# Patient Record
Sex: Male | Born: 1995 | Race: Black or African American | Hispanic: No | Marital: Single | Smoking: Never smoker
Health system: Southern US, Community
[De-identification: ages and names within clinical notes are randomized; demographics above are authoritative.]

## PROBLEM LIST (undated history)

## (undated) DIAGNOSIS — J302 Other seasonal allergic rhinitis: Secondary | ICD-10-CM

## (undated) HISTORY — DX: Other seasonal allergic rhinitis: J30.2

---
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
## 2015-06-01 ENCOUNTER — Emergency Department
Admission: EM | Admit: 2015-06-01 | Discharge: 2015-06-01 | Disposition: A | Discharge status: Home / Self Care | Payer: Medicaid Other | Attending: Emergency Medicine | Admitting: Emergency Medicine

## 2015-06-01 ENCOUNTER — Emergency Department: Payer: Medicaid Other

## 2015-06-01 ENCOUNTER — Encounter: Payer: Self-pay | Admitting: Emergency Medicine

## 2015-06-01 DIAGNOSIS — K5900 Constipation, unspecified: Secondary | ICD-10-CM | POA: Insufficient documentation

## 2015-06-01 MED ORDER — DOCUSATE SODIUM 100 MG PO CAPS: 100.0000 mg | ORAL_CAPSULE | Freq: Two times a day (BID) | ORAL | Status: DC

## 2015-06-01 NOTE — ED Notes (Signed)
Reports difficulty having bm x 1 wk.

## 2015-06-01 NOTE — ED Provider Notes (Signed)
CSN: [id number]     Arrival date & time 06/01/15  1121 History   First MD Initiated Contact with Patient 06/01/15 1142     Chief Complaint  Patient presents with  . Constipation     HPI Comments: 19 year old male presents today with complaints of constipation x 3 days. Pt reports that he has had difficulty having a bowel movement for the past 3 days. He has not had a regular bowel movement since last weekend. He is having small, hard stools throughout the day. No abdominal pain, nausea or vomiting. No history of abdominal surgeries.   Patient is a 19 y.o. male presenting with constipation. The history is provided by the patient.  Constipation Severity:  Mild Time since last bowel movement:  3 days Timing:  Constant Progression:  Unchanged Chronicity:  New Context: not dehydration, not dietary changes, not medication and not narcotics   Stool description:  Hard, small and pellet like Relieved by:  None tried Ineffective treatments:  None tried Associated symptoms: no abdominal pain, no anorexia, no fever, no nausea and no vomiting   Risk factors: no hx of abdominal surgery     History reviewed. No pertinent past medical history. History reviewed. No pertinent past surgical history. History reviewed. No pertinent family history. History  Substance Use Topics  . Smoking status: Never Smoker   . Smokeless tobacco: Not on file  . Alcohol Use: No    Review of Systems  Constitutional: Negative for fever, chills and appetite change.  Gastrointestinal: Positive for constipation. Negative for nausea, vomiting, abdominal pain, rectal pain and anorexia.  All other systems reviewed and are negative.     Allergies  Review of patient's allergies indicates no known allergies.  Home Medications   Prior to Admission medications   Medication Sig Start Date End Date Taking? Authorizing Provider  docusate sodium (DULCOLAX) 100 MG capsule Take 1 capsule (100 mg total) by mouth 2 (two)  times daily. 06/01/15   Wilber Oliphant V, PA-C   BP 141/81 mmHg  Pulse 81  Temp(Src) 98.4 F (36.9 C) (Oral)  Resp 18  Ht 6' (1.829 m)  Wt 200 lb (90.719 kg)  BMI 27.12 kg/m2  SpO2 99% Physical Exam  Constitutional: He is oriented to person, place, and time. Vital signs are normal. He appears well-developed and well-nourished.  Non-toxic appearance. He does not have a sickly appearance. He does not appear ill.  HENT:  Head: Normocephalic and atraumatic.  Neck: Normal range of motion. Neck supple.  Cardiovascular: Normal rate, regular rhythm, normal heart sounds and intact distal pulses.   Pulmonary/Chest: Effort normal and breath sounds normal.  Abdominal: Soft. Bowel sounds are normal. He exhibits no distension. There is no tenderness. There is no rebound and no guarding.  Musculoskeletal: Normal range of motion.  Lymphadenopathy:    He has no cervical adenopathy.  Neurological: He is alert and oriented to person, place, and time.  Skin: Skin is warm and dry.  Psychiatric: He has a normal mood and affect. His behavior is normal. Judgment and thought content normal.  Nursing note and vitals reviewed.   ED Course  Procedures (including critical care time) Labs Review Labs Reviewed - No data to display  Imaging Review Dg Abd 1 View  06/01/2015   CLINICAL DATA:  Difficulty with bowel movement.  EXAM: ABDOMEN - 1 VIEW  COMPARISON:  None.  FINDINGS: Normal bowel gas pattern. No dilated bowel loops. Normal amount of stool in the rectosigmoid.  No  abnormal calcifications.  No focal bony lesion.  IMPRESSION: Negative.   Electronically Signed   By: Marlan Palau M.D.   On: 06/01/2015 12:54     EKG Interpretation None      I reviewed Xrays above. No significant constipation. No bowel obstruction   MDM  Discussed findings with patient. Wrote for dulcolax to take as needed. Also gave handout on ways to prevent constipation. Return for new or worsening symptoms.  Final diagnoses:   Constipation, unspecified constipation type        Luvenia Redden, PA-C 06/01/15 1935  Jene Every, MD 06/02/15 [phone]

## 2015-06-01 NOTE — Discharge Instructions (Signed)

## 2015-11-24 ENCOUNTER — Encounter: Payer: Self-pay | Admitting: Physician Assistant

## 2015-11-24 ENCOUNTER — Ambulatory Visit (INDEPENDENT_AMBULATORY_CARE_PROVIDER_SITE_OTHER): Payer: Medicaid Other | Admitting: Physician Assistant

## 2015-11-24 VITALS — BP 120/68 | HR 80 | Temp 98.2°F | Resp 16 | Ht 73.0 in | Wt 214.2 lb

## 2015-11-24 DIAGNOSIS — Z0000 Encounter for general adult medical examination without abnormal findings: Secondary | ICD-10-CM | POA: Diagnosis not present

## 2015-11-24 DIAGNOSIS — Z7189 Other specified counseling: Secondary | ICD-10-CM

## 2015-11-24 DIAGNOSIS — Z7689 Persons encountering health services in other specified circumstances: Secondary | ICD-10-CM

## 2015-11-24 NOTE — Patient Instructions (Signed)

## 2015-11-24 NOTE — Progress Notes (Signed)
Patient: Alexander Noble, Male    DOB: 1996/07/30, 19 y.o.   MRN: [id number] Visit Date: 11/24/2015  Today's Provider: Margaretann Loveless, PA-C   Chief Complaint  Patient presents with  . Establish Care   Subjective:    Annual physical exam Alexander Noble is a 19 y.o. male who presents today for health maintenance and complete physical and to Establish Care as a new patient. He feels well. He reports exercising not regurlarly. He reports he is sleeping well. He has never had labs checked.  He has no complaints today.  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 He is planning to transfer to [employer] when he has his credits into special education teaching.  Patient  Last PCP was [facility].  -----------------------------------------------------------------  Review of Systems  Constitutional: Negative.   HENT: Negative.   Eyes: Negative.   Respiratory: Negative.   Cardiovascular: Negative.   Gastrointestinal: Negative.   Endocrine: Negative.   Genitourinary: Negative.   Musculoskeletal: Negative.   Skin: Negative.   Allergic/Immunologic: Negative.   Neurological: Negative.   Hematological: Negative.   Psychiatric/Behavioral: Negative.     Social History      He  reports that he has never smoked. He does not have any smokeless tobacco history on file. He reports that he does not drink alcohol.       Social History   Social History  . Marital Status: Single    Spouse Name: N/A  . Number of Children: N/A  . Years of Education: N/A   Social History Main Topics  . Smoking status: Never Smoker   . Smokeless tobacco: None  . Alcohol Use: No  . Drug Use: None  . Sexual Activity: Not Asked   Other Topics Concern  . None   Social History Narrative    There are no active problems to display for this patient.   History reviewed. No pertinent past surgical history.  Family History    Family Status  Relation Status Death Age  . Mother Alive   . Father Alive   . Brother Alive   . Maternal Grandmother Deceased   . Maternal Grandfather Deceased   . Paternal Grandmother Alive   . Paternal Grandfather Deceased         His family history is not on file.    No Known Allergies  Previous Medications   CETIRIZINE (ZYRTEC) 10 MG TABLET    Take by mouth.    Patient Care Team: Margaretann Loveless, PA-C as PCP - General (Family Medicine)     Objective:   Vitals: BP 120/68 mmHg  Pulse 80  Temp(Src) 98.2 F (36.8 C) (Oral)  Resp 16  Ht  (1.854 m)  Wt 214 lb 3.2 oz (97.16 kg)  BMI 28.27 kg/m2   Physical Exam  Constitutional: He is oriented to person, place, and time. He appears well-developed and well-nourished.  HENT:  Head: Normocephalic and atraumatic.  Right Ear: Hearing, tympanic membrane, external ear and ear canal normal.  Left Ear: Hearing, tympanic membrane, external ear and ear canal normal.  Nose: Nose normal.  Mouth/Throat: Uvula is midline, oropharynx is clear and moist and mucous membranes are normal.  Eyes: Conjunctivae and EOM are normal. Pupils are equal, round, and reactive to light. Right eye exhibits no discharge.  Neck: Normal range of motion. Neck supple. No tracheal deviation present. No thyromegaly present.  Cardiovascular: Normal rate, regular rhythm, normal  heart sounds and intact distal pulses.  Exam reveals no gallop and no friction rub.   No murmur heard. Pulmonary/Chest: Effort normal and breath sounds normal. No respiratory distress. He has no wheezes. He has no rales. He exhibits no tenderness.  Abdominal: Soft. He exhibits no distension and no mass. There is no tenderness. There is no rebound and no guarding.  Musculoskeletal: Normal range of motion. He exhibits no edema or tenderness.  Lymphadenopathy:    He has no cervical adenopathy.  Neurological: He is alert and oriented to person, place, and time. He has normal  reflexes. No cranial nerve deficit. He exhibits normal muscle tone. Coordination normal.  Skin: Skin is warm and dry. No rash noted. No erythema.  Psychiatric: He has a normal mood and affect. His behavior is normal. Judgment and thought content normal.     Depression Screen No flowsheet data found.    Assessment & Plan:     Routine Health Maintenance and Physical Exam  1. Annual physical exam Exam was normal today and he has no complaints. I will check labs as below as he has never had any blood work done for baseline studies. I will follow-up with him pending his lab results. If labs are stable and within normal limits I will see him back in one year for his repeat annual physical exam. - CBC with Differential - Comprehensive Metabolic Panel (CMET) - TSH - Lipid panel  2. Establishing care with new doctor, encounter for Establishing care. [facility]. Was last seen for his well-child visit in 2015. He is healthy and has never had any issues. He is up-to-date on vaccinations.   Exercise Activities and Dietary recommendations Goals    None       There is no immunization history on file for this patient.  Health Maintenance  Topic Date Due  . HIV Screening  11/18/2009  . TETANUS/TDAP  11/18/2013  . INFLUENZA VACCINE  07/17/2015      Discussed health benefits of physical activity, and encouraged him to engage in regular exercise appropriate for his age and condition.    --------------------------------------------------------------------

## 2015-12-13 ENCOUNTER — Telehealth: Payer: Self-pay | Admitting: Physician Assistant

## 2015-12-13 NOTE — Telephone Encounter (Signed)
Pt states [place] will going [place] and is asking if his immunizations are up to date.  WU#[phone]/NFCB#[phone]/MW

## 2015-12-13 NOTE — Telephone Encounter (Signed)
Spoke with patient, [place] but patient does not know which ones he needs, advised patient to call health departments and ask due to we do not have that insurance. Pt understood and will let us know-aa

## 2015-12-25 ENCOUNTER — Ambulatory Visit: Payer: Self-pay | Admitting: Physician Assistant

## 2016-01-03 ENCOUNTER — Telehealth: Payer: Self-pay | Admitting: Physician Assistant

## 2016-01-03 NOTE — Telephone Encounter (Signed)
Do you have this records with you. Can you let me know what immunizations he needs.  Thanks,  -Rosaria Kubin

## 2016-01-03 NOTE — Telephone Encounter (Signed)
Pt states he is going to the [place] in 2 weeks and is asking if he will need any immunization/shots before he takes this trip.  Pt states his previous doctor has faxed these records today. Pt states he has rec'd a text from nurse Purnell Shoemaker stating these records were rec'd.  ZO#[phone]/WJ

## 2016-01-03 NOTE — Telephone Encounter (Signed)
Yes and he may require flu, MMR 2 and HPV (if wanted). 

## 2016-01-04 NOTE — Telephone Encounter (Signed)
Patient advised that we don't have Typhoid vaccine. Patient verbalized understanding.  Thanks,   -Marylyn Appenzeller

## 2016-01-04 NOTE — Telephone Encounter (Signed)
Advised patient as directed below. Per patient he is only required the Typhoid Vaccine and wanted to know how much it was here because he wants to compare prices. Patient is not insured  Thanks,  -Darrill Vreeland

## 2016-05-01 ENCOUNTER — Ambulatory Visit (INDEPENDENT_AMBULATORY_CARE_PROVIDER_SITE_OTHER): Payer: Self-pay | Admitting: Physician Assistant

## 2016-05-01 ENCOUNTER — Encounter: Payer: Self-pay | Admitting: Physician Assistant

## 2016-05-01 VITALS — BP 140/88 | HR 92 | Temp 98.5°F | Resp 16 | Wt 217.6 lb

## 2016-05-01 DIAGNOSIS — J029 Acute pharyngitis, unspecified: Secondary | ICD-10-CM

## 2016-05-01 NOTE — Patient Instructions (Signed)

## 2016-05-01 NOTE — Progress Notes (Signed)
Patient: Alexander MealingChristopher G Aure Male    DOB: 09/20/1996   20 y.o.   MRN: [id number] Visit Date: 05/01/2016  Today's Provider: Margaretann LovelessJennifer M Richmond Coldren, PA-C   Chief Complaint  Patient presents with  . Sore Throat   Subjective:    Sore Throat  This is a new problem. The current episode started in the past 7 days (Monday morning). The problem has been gradually improving. Neither side of throat is experiencing more pain than the other. There has been no fever. The patient is experiencing no pain (currently; was a 3-4/10 on Sunday night, Monday and Tuesday). Pertinent negatives include no abdominal pain, congestion, coughing, ear discharge, ear pain, headaches, hoarse voice, plugged ear sensation, neck pain, shortness of breath, swollen glands, trouble swallowing or vomiting. He has had no exposure to strep or mono. Treatments tried: Severe Flu/cold tea([employer] brand) The treatment provided mild relief.      No Known Allergies Previous Medications   CETIRIZINE (ZYRTEC) 10 MG TABLET    Take by mouth.    Review of Systems  Constitutional: Negative for fever, chills and fatigue.  HENT: Positive for sore throat. Negative for congestion, ear discharge, ear pain, hoarse voice, postnasal drip, rhinorrhea, sinus pressure, sneezing and trouble swallowing.   Respiratory: Negative for cough, chest tightness and shortness of breath.   Cardiovascular: Negative for chest pain.  Gastrointestinal: Negative for nausea, vomiting and abdominal pain.  Musculoskeletal: Negative for neck pain.  Neurological: Negative for headaches.    Social History  Substance Use Topics  . Smoking status: Never Smoker   . Smokeless tobacco: Not on file  . Alcohol Use: No   Objective:   BP 140/88 mmHg  Pulse 92  Temp(Src) 98.5 F (36.9 C) (Oral)  Resp 16  Wt 217 lb 9.6 oz (98.703 kg)  Physical Exam  Constitutional: He appears well-developed and well-nourished. No distress.  HENT:  Head: Normocephalic and  atraumatic.  Right Ear: Hearing, tympanic membrane, external ear and ear canal normal. Tympanic membrane is not erythematous and not bulging. No middle ear effusion.  Left Ear: Hearing, tympanic membrane, external ear and ear canal normal. Tympanic membrane is not erythematous and not bulging.  No middle ear effusion.  Nose: Mucosal edema present. No rhinorrhea. Right sinus exhibits no maxillary sinus tenderness and no frontal sinus tenderness. Left sinus exhibits no maxillary sinus tenderness and no frontal sinus tenderness.  Mouth/Throat: Uvula is midline and mucous membranes are normal. Posterior oropharyngeal erythema present. No oropharyngeal exudate or posterior oropharyngeal edema.  Eyes: Conjunctivae and EOM are normal. Pupils are equal, round, and reactive to light. Right eye exhibits no discharge. Left eye exhibits no discharge.  Neck: Normal range of motion. Neck supple. No tracheal deviation present. No Brudzinski's sign and no Kernig's sign noted. No thyromegaly present.  Cardiovascular: Normal rate, regular rhythm and normal heart sounds.  Exam reveals no gallop and no friction rub.   No murmur heard. Pulmonary/Chest: Effort normal and breath sounds normal. No stridor. No respiratory distress. He has no wheezes. He has no rales.  Lymphadenopathy:    He has no cervical adenopathy.  Skin: Skin is warm and dry. He is not diaphoretic.  Vitals reviewed.       Assessment & Plan:     1. Viral pharyngitis Symptoms improving. Continue OTC symptomatic treatment as needed. Salt water gargles. IBU for fever if needed. Call if symptoms worsen again.       Margaretann LovelessJennifer M Kaidyn Javid, PA-C  [place]  [facility]

## 2016-09-11 ENCOUNTER — Emergency Department
Admission: EM | Admit: 2016-09-11 | Discharge: 2016-09-11 | Disposition: A | Discharge status: Home / Self Care | Payer: Medicaid Other | Attending: Emergency Medicine | Admitting: Emergency Medicine

## 2016-09-11 ENCOUNTER — Encounter: Payer: Self-pay | Admitting: Emergency Medicine

## 2016-09-11 DIAGNOSIS — K047 Periapical abscess without sinus: Secondary | ICD-10-CM | POA: Insufficient documentation

## 2016-09-11 DIAGNOSIS — K0889 Other specified disorders of teeth and supporting structures: Secondary | ICD-10-CM

## 2016-09-11 MED ORDER — AMOXICILLIN 500 MG PO CAPS: 500.0000 mg | ORAL_CAPSULE | Freq: Three times a day (TID) | ORAL | 0 refills | Status: DC

## 2016-09-11 NOTE — ED Notes (Addendum)
Pt states L sided tooth pain since eating bojangles chicken 2 days ago. Thought bone was stuck in teeth or chipped tooth.

## 2016-09-11 NOTE — ED Triage Notes (Addendum)
C/o pain to left back teeth, not sure if top or bottom. Denies fevers or swelling. Reports had bone in tooth from eating bojangles and pain still there.

## 2016-09-11 NOTE — ED Triage Notes (Signed)
Patient to ER for c/o dental pain after biting into chicken bone.

## 2016-09-11 NOTE — Discharge Instructions (Signed)
Take the antibiotic as directed. Rinse with warm-salty water after every meal. Follow-up with one of the dental clinics listed for routine care.   Take your antibiotic 3-times a day at 8 am / 2 pm / 8 pm daily.

## 2016-09-12 NOTE — ED Provider Notes (Signed)
[facility] Emergency Department Provider Note ____________________________________________  Time seen: 1912  I have reviewed the triage vital signs and the nursing notes.  HISTORY  Chief Complaint  Dental Pain  HPI Alexander Noble is a 20 y.o. male visits to the ED for evaluation of dental pain to the left lower molar after allegedly accidentally biting a small piece of chicken bone. He describes pain after smokeless chicken bone was lodged either between his molars or in the gum next to his molar. He was able to remove the small sliver of bone manually. Since that time he's had some increased tenderness and pain with chewing and biting to the right lower jaw. He denies any fevers, chills, sweats. Also denies any local swelling, or purulent drainage.He denies any continued foreign body sensation or any difficulty swallowing.  History reviewed. No pertinent past medical history.  There are no active problems to display for this patient.  History reviewed. No pertinent surgical history.  Prior to Admission medications   Medication Sig Start Date End Date Taking? Authorizing Provider  amoxicillin (AMOXIL) 500 MG capsule Take 1 capsule (500 mg total) by mouth 3 (three) times daily. 09/11/16   Shatana Saxton V Bacon Lydia Meng, PA-C  cetirizine (ZYRTEC) 10 MG tablet Take by mouth.    Historical Provider, MD    Allergies Review of patient's allergies indicates no known allergies.  History reviewed. No pertinent family history.  Social History Social History  Substance Use Topics  . Smoking status: Never Smoker  . Smokeless tobacco: Not on file  . Alcohol use No    Review of Systems  Constitutional: Negative for fever. Eyes: Negative for visual changes. ENT: Negative for sore throat. Dental pain as above.  Gastrointestinal: Negative for abdominal pain, vomiting and diarrhea. ____________________________________________  PHYSICAL EXAM:  VITAL SIGNS: ED  Triage Vitals  Enc Vitals Group     BP 09/11/16 1857 (!) 151/98     Pulse Rate 09/11/16 1857 99     Resp 09/11/16 1857 16     Temp 09/11/16 1857 97.3 F (36.3 C)     Temp Source 09/11/16 1857 Oral     SpO2 09/11/16 1857 99 %     Weight 09/11/16 1855 150 lb (68 kg)     Height 09/11/16 1855 5\' 9"  (1.753 m)     Head Circumference --      Peak Flow --      Pain Score 09/11/16 1856 8     Pain Loc --      Pain Edu? --      Excl. in GC? --    Constitutional: Alert and oriented. Well appearing and in no distress. Head: Normocephalic and atraumatic. Eyes: Conjunctivae are normal. PERRL. Normal extraocular movements Mouth/Throat: Mucous membranes are moist. Patient with a partially erupted wisdom tooth and lower left jaw. There does appear to be some local gum erythema and mild edema. No fluctuance, pointing, spontaneous drainage is appreciated. Patient with generalized gingivitis on exam. Neck: Supple. No thyromegaly. Hematological/Lymphatic/Immunological: No cervical lymphadenopathy. Neurologic: Normal speech and language. No gross focal neurologic deficits are appreciated. Psychiatric: Mood and affect are normal. Patient exhibits appropriate insight and judgment. ____________________________________________  INITIAL IMPRESSION / ASSESSMENT AND PLAN / ED COURSE  Patient with acute dental pain and probable local gum infection following a foreign body to the gum. He is discharged with a prescription for moxifloxacin 2 doses directed. He is also advised to utilize warm salt water or Listerine for mild antiseptic. He will  follow with the local dental providers for ongoing symptom management.  Clinical Course   ____________________________________________  FINAL CLINICAL IMPRESSION(S) / ED DIAGNOSES  Final diagnoses:  Pain, dental  Dental infection      Lissa HoardJenise V Bacon Janeah Kovacich, PA-C 09/12/16 0114    Minna AntisKevin Paduchowski, MD 09/12/16 2300

## 2016-12-02 IMAGING — CR DG ABDOMEN 1V
1 series · 2 of 2 positions shown · non-contrast
Comparison: None.

CLINICAL DATA: Difficulty with bowel movement.

EXAM:
ABDOMEN - 1 VIEW

[Series 1: dg abd 1 view · 0.14mm/px · 2 of 2 slices shown]
[im 1/2]
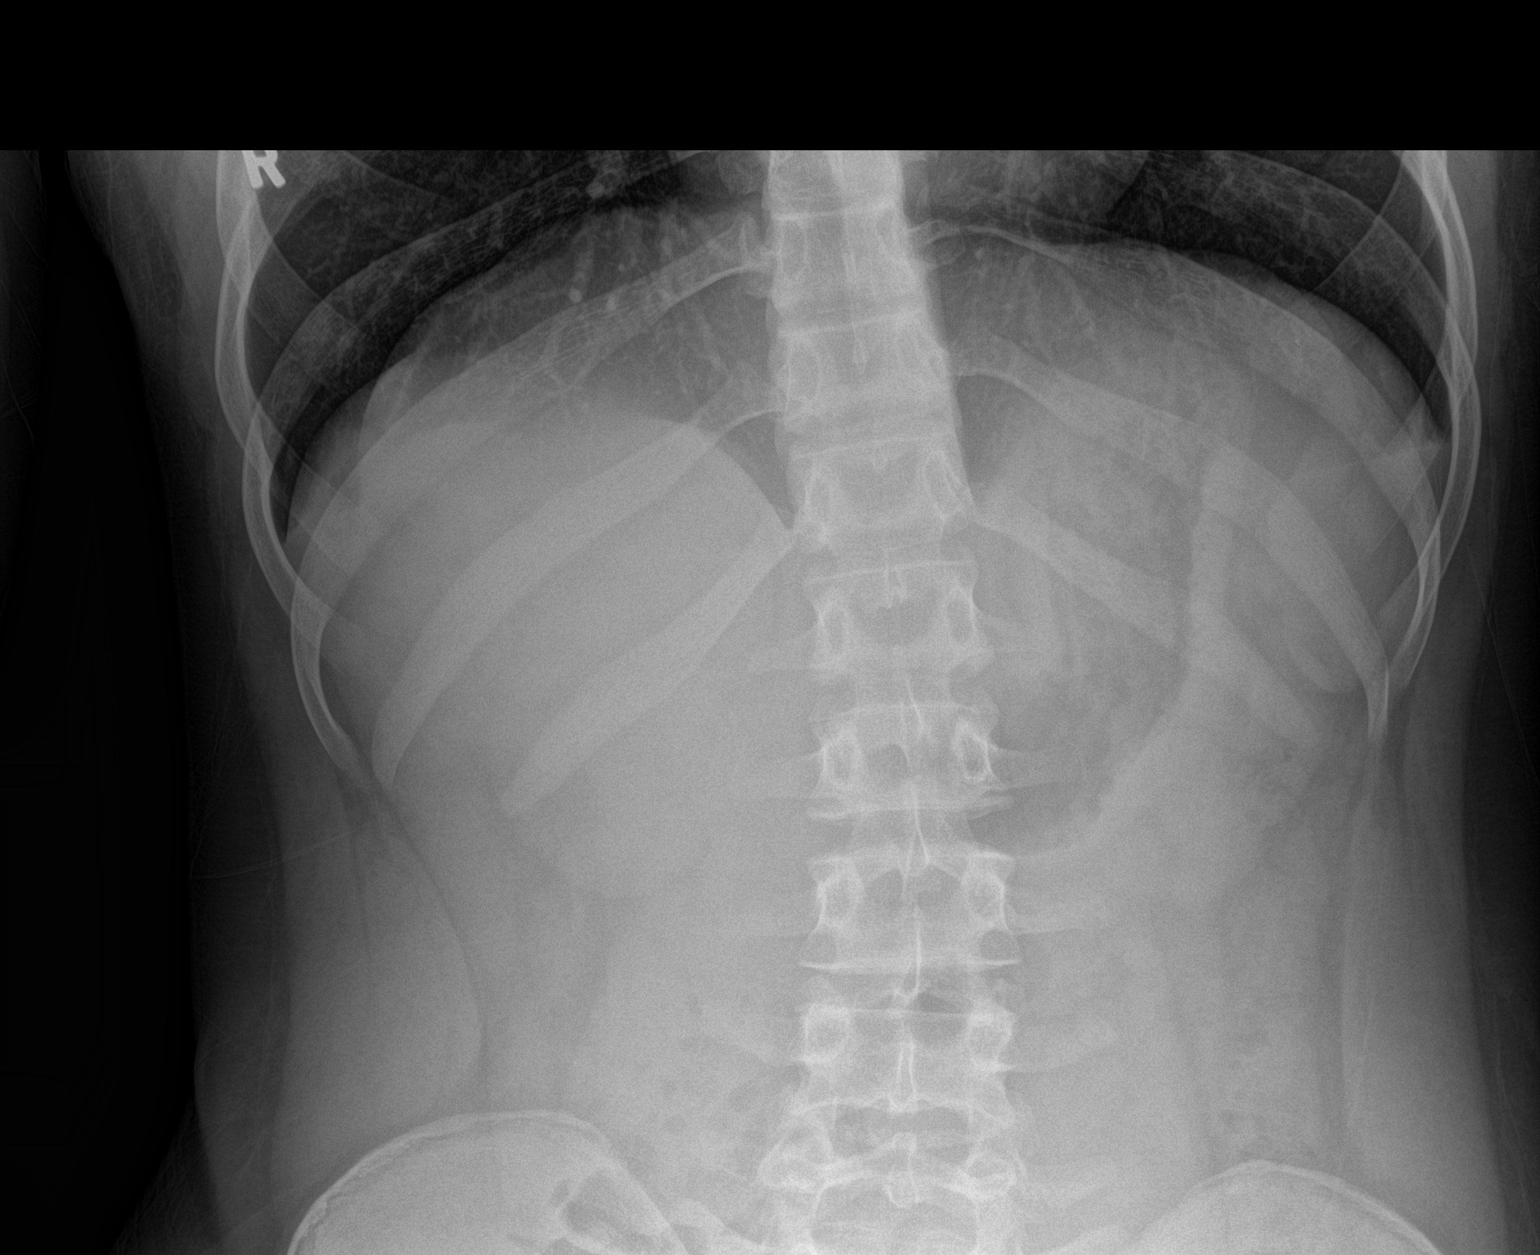
[im 2/2]
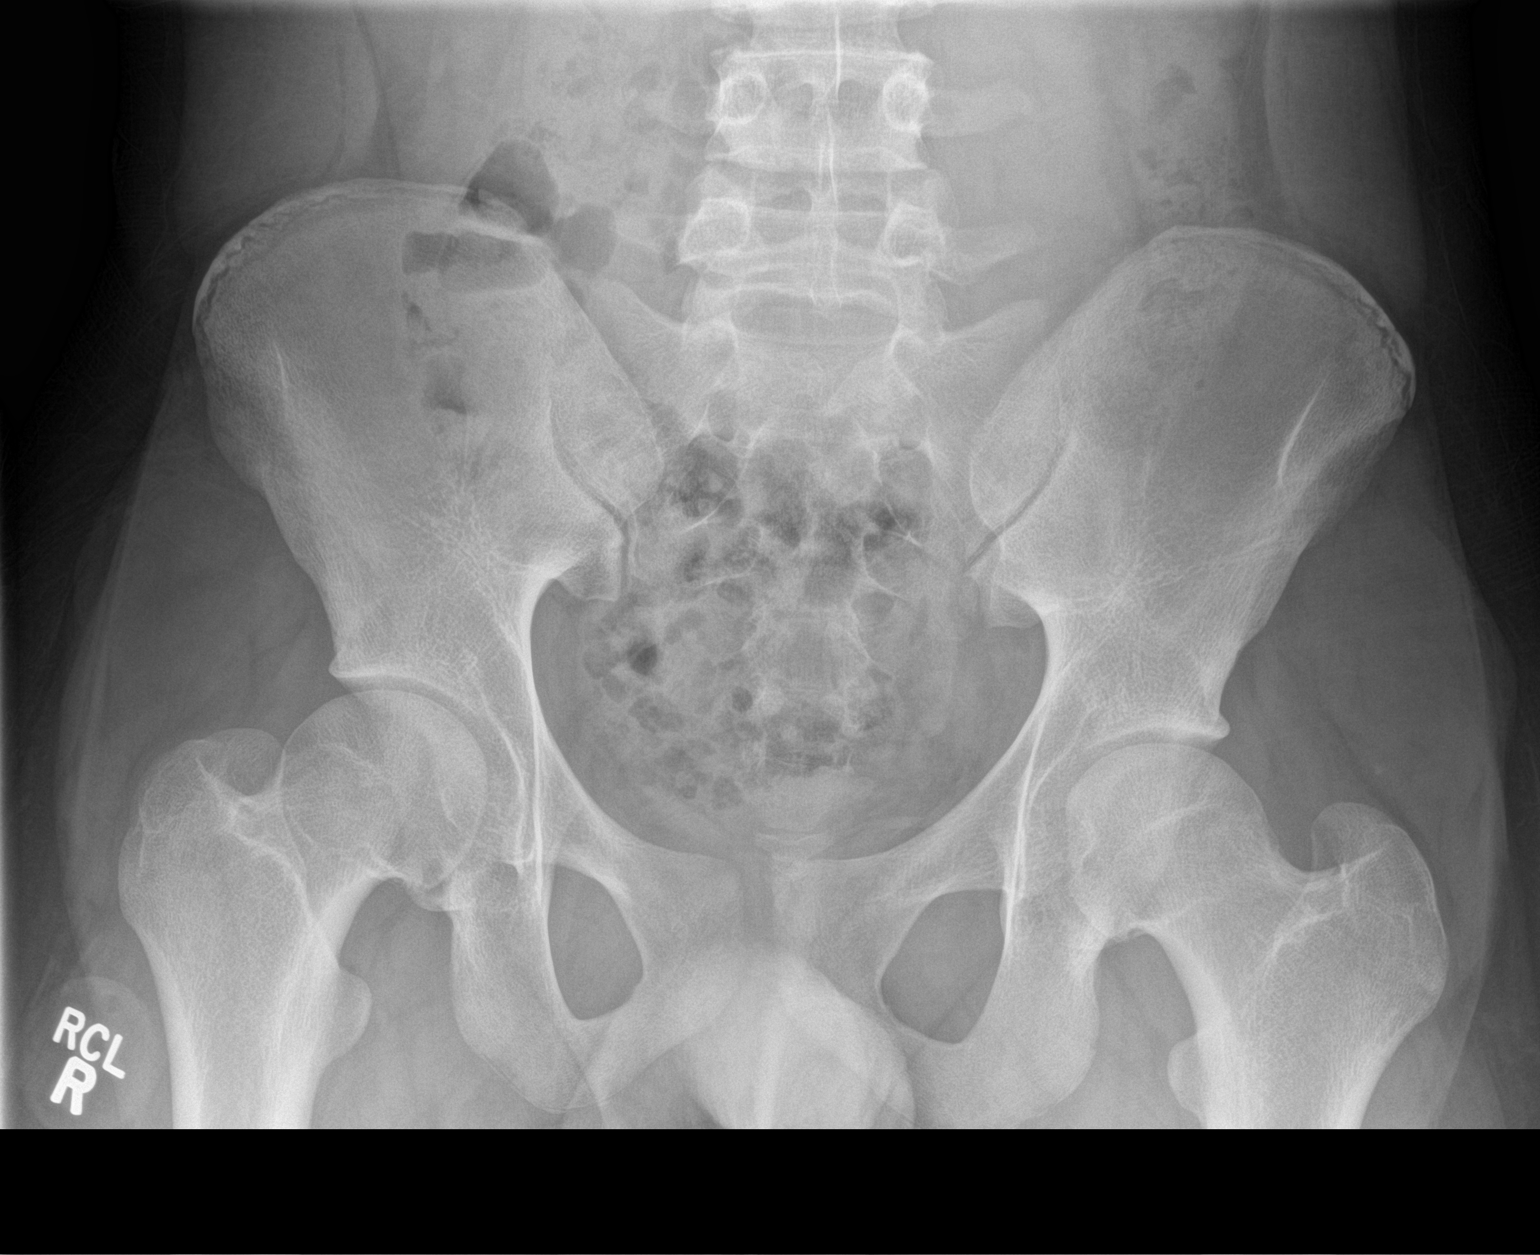

[2 of 2 positions shown; findings below may reference images not displayed]

FINDINGS: Normal bowel gas pattern. No dilated bowel loops. Normal amount of
stool in the rectosigmoid.

No abnormal calcifications.  No focal bony lesion.
IMPRESSION: Negative.

## 2019-03-17 ENCOUNTER — Encounter: Payer: Self-pay | Admitting: Internal Medicine

## 2019-03-17 ENCOUNTER — Other Ambulatory Visit: Payer: Self-pay

## 2019-03-17 ENCOUNTER — Ambulatory Visit (INDEPENDENT_AMBULATORY_CARE_PROVIDER_SITE_OTHER): Payer: Self-pay | Admitting: Internal Medicine

## 2019-03-17 DIAGNOSIS — K602 Anal fissure, unspecified: Secondary | ICD-10-CM

## 2019-03-17 NOTE — Patient Instructions (Signed)
Please read the handout regarding anal fissure.  You can use the medication that was prescribed by primary care and if you get bad headaches call us back.  We can try something different.  We hope to contact you in about a month to schedule a follow-up appointment but I also asked that you remember to call back and set one up so you can have an in person appointment eventually when the virus restrictions are lifted.  If you are not improving we can do that sooner but I think based upon what I know at this point we have a good treatment plan.  I appreciate the opportunity to care for you. Iva Boop, MD, [facility]

## 2019-03-17 NOTE — Progress Notes (Signed)
    TELEHEALTH ENCOUNTER IN SETTING OF COVID-19 PANDEMIC - REQUESTED BY PATIENT SERVICE PROVIDED BY TELEMEDECINE - TYPE: Phone due to A/V failure PATIENT LOCATION: Home PATIENT HAS CONSENTED TO TELEHEALTH VISIT PROVIDER LOCATION: OFFICE TIME SPENT ON CALL: 20 minutes    Alexander Noble 23 y.o. 12/05/1996 [id number]  Assessment & Plan:  Anal fissure History and prior evaluation consistent with this.  He had a rectal exam on March 12 without any mass Agree w/ lidocaine, NTG and MiraLax He will need to call back in early May to see about f/u If gets headaches with 0.4% NTG then call me and will use compounded medication instead - probably diltiazem/lidocaine combo He can use OTC 5% lidocaine cream instead of expensive rx  We will send him a copy of the anal fissure handout and place him on our list to be seen in the future when we can schedule in person follow-up visits after the COVID-19 pandemic restrictions are lifted.  He knows to call back sooner if necessary.   Subjective:   Chief Complaint: Rectal pain and rectal bleeding  HPI The patient is seen through telehealth, attempted A/V but unable to complete that, with about a one-month history of painful defecation that then also includes symptoms of rectal bleeding with slight blood on the toilet paper and streaked on the stool and some in the commode.  Bright red.  Has knifelike pain with defecation and then burning pain is quite severe afterwards.  He has seen primary care a few times in the last month, initially thought to have hemorrhoids then diagnosed with an anal fissure this past week.  He has been prescribed lidocaine and nitroglycerin ointments.  He has not picked them up yet.  The lidocaine was very expensive so he canceled that.  Nitroglycerin is the 0.4%.  Denies any antecedent diarrhea, there is no insertion of anything into the anus or rectum, and he is never had problems like this before.  In general GI review of  systems is otherwise completely negative.     No Known Allergies No outpatient medications have been marked as taking for the 03/17/19 encounter (Office Visit) with Iva Boop, MD.   Past Medical History:  Diagnosis Date  . Seasonal allergies    History reviewed. No pertinent surgical history. 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

## 2019-03-17 NOTE — Assessment & Plan Note (Addendum)
History and prior evaluation consistent with this.  He had a rectal exam on March 12 without any mass Agree w/ lidocaine, NTG and MiraLax He will need to call back in early May to see about f/u If gets headaches with 0.4% NTG then call me and will use compounded medication instead - probably diltiazem/lidocaine combo He can use OTC 5% lidocaine cream instead of expensive rx  We will send him a copy of the anal fissure handout and place him on our list to be seen in the future when we can schedule in person follow-up visits after the COVID-19 pandemic restrictions are lifted.  He knows to call back sooner if necessary.

## 2019-04-29 ENCOUNTER — Telehealth: Payer: Self-pay | Admitting: Internal Medicine

## 2019-04-29 NOTE — Telephone Encounter (Signed)
Per Dr. Marvell Fuller request called patient to schedule OV in person for anal fissure.  Spoke w/patient and he said he is feeling much better and will call back as needed

## 2019-05-24 ENCOUNTER — Telehealth: Payer: Self-pay | Admitting: Internal Medicine

## 2019-05-24 NOTE — Telephone Encounter (Signed)
Patient had questions about taking Miralax and anal fissure.  All questions answered.  He has a follow up on 06/08/19.  All questions answered.  He will call back for additional questions or concerns.

## 2019-06-08 ENCOUNTER — Telehealth: Payer: Self-pay

## 2019-06-08 ENCOUNTER — Ambulatory Visit: Payer: Self-pay | Admitting: Internal Medicine

## 2019-06-08 NOTE — Telephone Encounter (Signed)
  I called him to COVID screen him and he had forgotten about the appointment  and made a job interview for his appointment time today. He's doing well and r/s'ed to July 23 rd.

## 2019-07-07 ENCOUNTER — Telehealth: Payer: Self-pay

## 2019-07-07 NOTE — Telephone Encounter (Signed)
Covid-19 screening questions   Do you now or have you had a fever in the last 14 days? No  Do you have any respiratory symptoms of shortness of breath or cough now or in the last 14 days? No  Do you have any family members or close contacts with diagnosed or suspected Covid-19 in the past 14 days? No  Have you been tested for Covid-19 and found to be positive? Tested early July and found to be negative

## 2019-07-08 ENCOUNTER — Ambulatory Visit: Payer: Self-pay | Admitting: Internal Medicine

## 2020-03-02 ENCOUNTER — Ambulatory Visit: Payer: Self-pay | Attending: Internal Medicine

## 2020-03-02 DIAGNOSIS — Z23 Encounter for immunization: Secondary | ICD-10-CM

## 2020-03-02 NOTE — Progress Notes (Signed)
   Covid-19 Vaccination Clinic  Name:  Alexander Noble    MRN: [id number] DOB: Oct 04, 1996  03/02/2020  Alexander Noble was observed post Covid-19 immunization for 15 minutes without incident. He was provided with Vaccine Information Sheet and instruction to access the V-Safe system.   Alexander Noble was instructed to call 911 with any severe reactions post vaccine: Marland Kitchen Difficulty breathing  . Swelling of face and throat  . A fast heartbeat  . A bad rash all over body  . Dizziness and weakness   Immunizations Administered    Name Date Dose VIS Date Route   Pfizer COVID-19 Vaccine 03/02/2020  4:28 PM 0.3 mL 11/26/2019 Intramuscular   Manufacturer: [employer], [employer]   Lot: [id number]   NDC: [address]-1

## 2020-03-27 ENCOUNTER — Ambulatory Visit: Payer: Self-pay | Attending: Internal Medicine

## 2020-03-27 DIAGNOSIS — Z23 Encounter for immunization: Secondary | ICD-10-CM

## 2020-03-27 NOTE — Progress Notes (Signed)
   Covid-19 Vaccination Clinic  Name:  Alexander Noble    MRN: [id number] DOB: 1996-09-11  03/27/2020  [name] was observed post Covid-19 immunization for 15 minutes without incident. He was provided with Vaccine Information Sheet and instruction to access the V-Safe system.   [name] was instructed to call 911 with any severe reactions post vaccine: Marland Kitchen Difficulty breathing  . Swelling of face and throat  . A fast heartbeat  . A bad rash all over body  . Dizziness and weakness   Immunizations Administered    Name Date Dose VIS Date Route   Pfizer COVID-19 Vaccine 03/27/2020  3:40 PM 0.3 mL 11/26/2019 Intramuscular   Manufacturer: [employer], [employer]   Lot: [id number]   NDC: [address]-7

## 2022-08-12 ENCOUNTER — Encounter (HOSPITAL_BASED_OUTPATIENT_CLINIC_OR_DEPARTMENT_OTHER): Payer: Self-pay | Admitting: Emergency Medicine

## 2022-08-12 ENCOUNTER — Emergency Department (HOSPITAL_BASED_OUTPATIENT_CLINIC_OR_DEPARTMENT_OTHER)
Admission: EM | Admit: 2022-08-12 | Discharge: 2022-08-12 | Disposition: A | Discharge status: Home / Self Care | Payer: BC Managed Care – PPO | Attending: Emergency Medicine | Admitting: Emergency Medicine

## 2022-08-12 ENCOUNTER — Emergency Department (HOSPITAL_BASED_OUTPATIENT_CLINIC_OR_DEPARTMENT_OTHER): Payer: BC Managed Care – PPO

## 2022-08-12 DIAGNOSIS — R0789 Other chest pain: Secondary | ICD-10-CM | POA: Diagnosis not present

## 2022-08-12 DIAGNOSIS — R079 Chest pain, unspecified: Secondary | ICD-10-CM | POA: Diagnosis present

## 2022-08-12 DIAGNOSIS — R1013 Epigastric pain: Secondary | ICD-10-CM | POA: Insufficient documentation

## 2022-08-12 LAB — BASIC METABOLIC PANEL
Anion gap: 11 (ref 5–15)
BUN: 14 mg/dL (ref 6–20)
CO2: 22 mmol/L (ref 22–32)
Calcium: 9.1 mg/dL (ref 8.9–10.3)
Chloride: 104 mmol/L (ref 98–111)
Creatinine, Ser: 1.15 mg/dL (ref 0.61–1.24)
GFR, Estimated: >60 mL/min (ref >60)
Glucose, Bld: 95 mg/dL (ref 70–99)
Potassium: 3.7 mmol/L (ref 3.5–5.1)
Sodium: 137 mmol/L (ref 135–145)

## 2022-08-12 LAB — CBC
HCT: 43.3 % (ref 39.0–52.0)
Hemoglobin: 15.4 g/dL (ref 13.0–17.0)
MCH: 28.6 pg (ref 26.0–34.0)
MCHC: 35.6 g/dL (ref 30.0–36.0)
MCV: 80.5 fL (ref 80.0–100.0)
Platelets: 255 10*3/uL (ref 150–400)
RBC: 5.38 MIL/uL (ref 4.22–5.81)
RDW: 12.7 % (ref 11.5–15.5)
WBC: 4.9 10*3/uL (ref 4.0–10.5)
nRBC: 0 % (ref 0.0–0.2)

## 2022-08-12 LAB — TROPONIN I (HIGH SENSITIVITY)
Troponin I (High Sensitivity): <2 ng/L (ref <18)
Troponin I (High Sensitivity): <2 ng/L (ref <18)

## 2022-08-12 LAB — D-DIMER, QUANTITATIVE: D-Dimer, Quant: <0.27 ug/mL-FEU (ref 0.00–0.50)

## 2022-08-12 NOTE — ED Triage Notes (Signed)
Pt states he bent down and stood back up at work, then had sudden onset of left sided chest pain. Denies radiation. States breathing in worsens pain, but denies shob. Denies dizziness, n/v, diaphoresis.

## 2022-08-12 NOTE — ED Provider Notes (Addendum)
[facility] EMERGENCY DEPARTMENT Provider Note   CSN: [id number] Arrival date & time: 08/12/22  1103     History  Chief Complaint  Patient presents with   Chest Pain    Alexander Noble is a 26 y.o. male.   Chest Pain    26 year old male with no significant medical history presenting to the emergency department with epigastric and chest pain.  The patient states that he has substernal and some left-sided chest pain.  Some radiation to the back between his shoulder blades.  He describes it as a sharp and intermittent burning sensation.  He has had some pain like this in the past.  He endorses some pleuritic component to it as he describes pain when taking a deep breath.  He denies any shortness of breath.  He denies any nausea, vomiting, diaphoresis.  He denies any lightheadedness, dizziness or syncope.  He denies any diarrhea, fevers or chills.  He denies any sick contacts or cough.  Home Medications Prior to Admission medications   Medication Sig Start Date End Date Taking? Authorizing Provider  cetirizine (ZYRTEC) 10 MG tablet Take by mouth.    [provider]  Lidocaine, Anorectal, 5 % CREA Place 1 application rectally 3 (three) times daily as needed. 03/16/19   [provider]  Nitroglycerin 0.4 % OINT Place 1 application rectally 3 (three) times daily.  03/16/19   [provider]      Allergies    Patient has no known allergies.    Review of Systems   Review of Systems  Cardiovascular:  Positive for chest pain.  All other systems reviewed and are negative.   Physical Exam Updated Vital Signs BP (!) 132/92   Pulse 86   Temp 98 F (36.7 C) (Oral)   Resp 16   Ht 5\' 11"  (1.803 m)   Wt 111.1 kg   SpO2 100%   BMI 34.17 kg/m  Physical Exam Vitals and nursing note reviewed.  Constitutional:      General: He is not in acute distress.    Appearance: He is well-developed.  HENT:     Head: Normocephalic and atraumatic.  Eyes:      Conjunctiva/sclera: Conjunctivae normal.  Cardiovascular:     Rate and Rhythm: Normal rate and regular rhythm.     Heart sounds: No murmur heard. Pulmonary:     Effort: Pulmonary effort is normal. No respiratory distress.     Breath sounds: Normal breath sounds.  Chest:     Comments: No chest wall tenderness to palpation, no rash Abdominal:     Palpations: Abdomen is soft.     Tenderness: There is no abdominal tenderness.  Musculoskeletal:        General: No swelling.     Cervical back: Neck supple.  Skin:    General: Skin is warm and dry.     Capillary Refill: Capillary refill takes less than 2 seconds.  Neurological:     Mental Status: He is alert.  Psychiatric:        Mood and Affect: Mood normal.     ED Results / Procedures / Treatments   Labs (all labs ordered are listed, but only abnormal results are displayed) Labs Reviewed  BASIC METABOLIC PANEL  CBC  D-DIMER, QUANTITATIVE  TROPONIN I (HIGH SENSITIVITY)  TROPONIN I (HIGH SENSITIVITY)    EKG EKG Interpretation  Date/Time:  Monday August 12 2022 11:16:55 EDT Ventricular Rate:  110 PR Interval:  140 QRS Duration: 104 QT  Interval:  340 QTC Calculation: 460 R Axis:   42 Text Interpretation: Sinus tachycardia Incomplete right bundle branch block Possible Inferior infarct , age undetermined Abnormal ECG No previous ECGs available Confirmed by Ernie Avena ([id number]) on 08/12/2022 11:50:14 AM  Radiology DG Chest 2 View  Result Date: 08/12/2022 CLINICAL DATA:  Chest pain EXAM: CHEST - 2 VIEW COMPARISON:  Chest radiograph 05/01/2008 FINDINGS: The cardiomediastinal contours are within normal limits. The lungs are clear. No pneumothorax or pleural effusion. No acute finding in the visualized skeleton. IMPRESSION: No acute cardiopulmonary process. Electronically Signed   By: Emmaline Kluver M.D.   On: 08/12/2022 11:33    Procedures Procedures    Medications Ordered in ED Medications - No data to display  ED  Course/ Medical Decision Making/ A&P                           Medical Decision Making Amount and/or Complexity of Data Reviewed Labs: ordered. Radiology: ordered.    26 year old male with no significant medical history presenting to the emergency department with epigastric and chest pain.  The patient states that he has substernal and some left-sided chest pain.  Some radiation to the back between his shoulder blades.  He describes it as a sharp and intermittent burning sensation.  He has had some pain like this in the past.  He endorses some pleuritic component to it as he describes pain when taking a deep breath.  He denies any shortness of breath.  He denies any nausea, vomiting, diaphoresis.  He denies any lightheadedness, dizziness or syncope.  He denies any diarrhea, fevers or chills.  He denies any sick contacts or cough.  On arrival, the patient was vitally stable, afebrile, mildly tachycardic P1 12, not tachypneic, normotensive, saturating 98% on room air.  On my evaluation, normal sinus rhythm was noted on cardiac telemetry.  Physical exam significant for lungs clear to auscultation bilaterally, no murmurs rubs or gallops, no rash along the chest wall, no chest wall tenderness to palpation.  Differential diagnosis includes GERD considered most likely, pneumonia, pneumothorax, PE,  less likely ACS.  Initial EKG revealed sinus tachycardia, rate 110, right bundle branch block present, no ST segment changes to indicate ongoing ischemia.  Chest x-ray performed revealed no evidence of pneumothorax or pneumonia.  Laboratory evaluation significant for CBC without a leukocytosis or anemia, BMP unremarkable, D-dimer negative, troponins x2 negative.  Normal troponins and D-dimer, low suspicion for ACS or PE.  Low concern for acute intrathoracic or intra-abdominal abnormality that would cause the patient's presentation today.  Patient has a reassuring imaging and laboratory work-up and a  reassuring physical exam.  His symptoms are most consistent with likely gastroesophageal reflux.  I recommended the patient follow-up outpatient with his PCP to discuss further management, to include consideration for cardiac stress testing.   Final Clinical Impression(s) / ED Diagnoses Final diagnoses:  Atypical chest pain    Rx / DC Orders ED Discharge Orders     None         Ernie Avena, MD 08/13/22 Luiz Iron    Ernie Avena, MD 08/13/22 1842

## 2022-08-12 NOTE — ED Notes (Signed)
Discharge instructions reviewed with patient. Patient verbalizes understanding, no further questions at this time. Medications and follow up information provided. No acute distress noted at time of departure.  

## 2022-08-12 NOTE — Discharge Instructions (Addendum)
Recommend you follow-up with your PCP for consideration for cardiac stress testing. Your workup today was reassuring.
